# Patient Record
Sex: Female | Born: 2000 | Race: Black or African American | Hispanic: No | Marital: Single | Smoking: Never smoker
Health system: Southern US, Community
[De-identification: ages and names within clinical notes are randomized; demographics above are authoritative.]

## PROBLEM LIST (undated history)

## (undated) ENCOUNTER — Inpatient Hospital Stay (HOSPITAL_COMMUNITY): Payer: Self-pay

---

## 2018-07-13 ENCOUNTER — Inpatient Hospital Stay (HOSPITAL_COMMUNITY): Payer: BLUE CROSS/BLUE SHIELD

## 2018-07-13 ENCOUNTER — Encounter (HOSPITAL_COMMUNITY): Payer: Self-pay | Admitting: *Deleted

## 2018-07-13 ENCOUNTER — Inpatient Hospital Stay (HOSPITAL_COMMUNITY)
Admission: AD | Admit: 2018-07-13 | Discharge: 2018-07-13 | Disposition: A | Discharge status: Home / Self Care | Payer: BLUE CROSS/BLUE SHIELD | Source: Ambulatory Visit | Attending: Family Medicine | Admitting: Family Medicine

## 2018-07-13 DIAGNOSIS — O26891 Other specified pregnancy related conditions, first trimester: Secondary | ICD-10-CM | POA: Diagnosis not present

## 2018-07-13 DIAGNOSIS — O418X10 Other specified disorders of amniotic fluid and membranes, first trimester, not applicable or unspecified: Secondary | ICD-10-CM

## 2018-07-13 DIAGNOSIS — Z3A08 8 weeks gestation of pregnancy: Secondary | ICD-10-CM | POA: Insufficient documentation

## 2018-07-13 DIAGNOSIS — O208 Other hemorrhage in early pregnancy: Secondary | ICD-10-CM | POA: Insufficient documentation

## 2018-07-13 DIAGNOSIS — R109 Unspecified abdominal pain: Secondary | ICD-10-CM | POA: Insufficient documentation

## 2018-07-13 DIAGNOSIS — O468X1 Other antepartum hemorrhage, first trimester: Secondary | ICD-10-CM

## 2018-07-13 DIAGNOSIS — O3680X0 Pregnancy with inconclusive fetal viability, not applicable or unspecified: Secondary | ICD-10-CM

## 2018-07-13 DIAGNOSIS — Z3401 Encounter for supervision of normal first pregnancy, first trimester: Secondary | ICD-10-CM

## 2018-07-13 LAB — URINALYSIS, ROUTINE W REFLEX MICROSCOPIC
Bilirubin Urine: NEGATIVE
Glucose, UA: NEGATIVE mg/dL
Hgb urine dipstick: NEGATIVE
Ketones, ur: NEGATIVE mg/dL
Leukocytes, UA: NEGATIVE
NITRITE: NEGATIVE
Protein, ur: NEGATIVE mg/dL
SPECIFIC GRAVITY, URINE: 1.011 (ref 1.005–1.030)
pH: 7 (ref 5.0–8.0)

## 2018-07-13 LAB — CBC
HEMATOCRIT: 33.4 % — AB (ref 36.0–46.0)
Hemoglobin: 11.5 g/dL — ABNORMAL LOW (ref 12.0–15.0)
MCH: 28.8 pg (ref 26.0–34.0)
MCHC: 34.4 g/dL (ref 30.0–36.0)
MCV: 83.5 fL (ref 78.0–100.0)
Platelets: 180 10*3/uL (ref 150–400)
RBC: 4 MIL/uL (ref 3.87–5.11)
RDW: 12.6 % (ref 11.5–15.5)
WBC: 4.4 10*3/uL (ref 4.0–10.5)

## 2018-07-13 LAB — TYPE AND SCREEN
ABO/RH(D): O POS
Antibody Screen: NEGATIVE

## 2018-07-13 LAB — HCG, QUANTITATIVE, PREGNANCY: HCG, BETA CHAIN, QUANT, S: 175800 m[IU]/mL — AB (ref <5)

## 2018-07-13 LAB — WET PREP, GENITAL
Clue Cells Wet Prep HPF POC: NONE SEEN
Sperm: NONE SEEN
TRICH WET PREP: NONE SEEN
WBC, Wet Prep HPF POC: NONE SEEN
Yeast Wet Prep HPF POC: NONE SEEN

## 2018-07-13 LAB — POCT PREGNANCY, URINE: Preg Test, Ur: POSITIVE — AB

## 2018-07-13 LAB — ABO/RH: ABO/RH(D): O POS

## 2018-07-13 MED ORDER — VITAFOL-OB+DHA 65-1 & 250 MG PO MISC: 1.0000 | Freq: Every day | ORAL | 3 refills | Status: AC

## 2018-07-13 NOTE — MAU Provider Note (Addendum)
History     CSN: [id number]  Arrival date and time: 07/13/18 1340   First Provider Initiated Contact with Patient 07/13/18 1423      Chief Complaint  Patient presents with  . Abdominal Pain  . Vaginal Bleeding  . Possible Pregnancy   HPI  Katrina Black is a 17 y.o. G1P0 at [redacted]w[redacted]d by LMP who presents to MAU with chief complaint of abdominal cramping and vaginal spotting, onset two days ago. Denies abnormal vaginal discharge, fever, falls, or recent illness.  Has not initiated prenatal care, no previous imaging this pregnancy.  Abdominal Cramping  This is a new problem, onset two days ago. Pain is bilateral low abdomen radiating to low back, 1-2/10, denies aggravating or alleviating factor. Patient also endorses intermittent abdominal cramping that is suprapubic and occurs with voiding  Vaginal spotting This is a new problem, onset two days ago in conjunction with abdominal cramping. Patient states she saturated two pantiliners over the course of a 24 hour period. Most recent sexual intercourse 2-3 days ago, denies pain during sex.   OB History    Gravida  1   Para      Term      Preterm      AB      Living        SAB      TAB      Ectopic      Multiple      Live Births              Social History   Tobacco Use  . Smoking status: Never Smoker  . Smokeless tobacco: Never Used  Substance Use Topics  . Alcohol use: Not Currently    Comment: occasional  . Drug use: Not on file    Allergies: No Known Allergies  No medications prior to admission.    Review of Systems  Constitutional: Negative for fatigue and fever.  Gastrointestinal: Positive for abdominal pain. Negative for nausea and vomiting.  Genitourinary: Positive for dysuria and vaginal bleeding. Negative for difficulty urinating, dyspareunia and pelvic pain.  Musculoskeletal: Positive for back pain.  Neurological: Negative for headaches.  All other systems reviewed and are negative.  Physical  Exam   Blood pressure 115/62, pulse 66, temperature 98.4 F (36.9 C), temperature source Oral, resp. rate 17, height 5' 7.5" (1.715 m), weight 62.6 kg, last menstrual period 05/18/2018, SpO2 100 %.  Physical Exam  Nursing note and vitals reviewed. Constitutional: She is oriented to person, place, and time. She appears well-developed and well-nourished.  Cardiovascular: Normal rate and intact distal pulses.  Respiratory: Effort normal and breath sounds normal.  GI: Soft. Bowel sounds are normal. She exhibits no distension. There is no tenderness. There is no rebound, no guarding and no CVA tenderness.  Genitourinary: Vagina normal and uterus normal.  Genitourinary Comments: No bleeding or abnormal discharge visualized on SSE   Neurological: She is alert and oriented to person, place, and time.  Skin: Skin is warm and dry.  Psychiatric: She has a normal mood and affect. Her behavior is normal. Judgment and thought content normal.    MAU Course  Procedures  MDM   Patient Vitals for the past 24 hrs:  BP Temp Temp src Pulse Resp SpO2 Height Weight  07/13/18 1358 115/62 98.4 F (36.9 C) Oral 66 17 100 % 5' 7.5" (1.715 m) 62.6 kg    Results for orders placed or performed during the hospital encounter of 07/13/18 (from the past 24  hour(s))  Urinalysis, Routine w reflex microscopic     Status: None   Collection Time: 07/13/18  2:14 PM  Result Value Ref Range   Color, Urine YELLOW YELLOW   APPearance CLEAR CLEAR   Specific Gravity, Urine 1.011 1.005 - 1.030   pH 7.0 5.0 - 8.0   Glucose, UA NEGATIVE NEGATIVE mg/dL   Hgb urine dipstick NEGATIVE NEGATIVE   Bilirubin Urine NEGATIVE NEGATIVE   Ketones, ur NEGATIVE NEGATIVE mg/dL   Protein, ur NEGATIVE NEGATIVE mg/dL   Nitrite NEGATIVE NEGATIVE   Leukocytes, UA NEGATIVE NEGATIVE  Pregnancy, urine POC     Status: Abnormal   Collection Time: 07/13/18  2:17 PM  Result Value Ref Range   Preg Test, Ur POSITIVE (A) NEGATIVE  CBC      Status: Abnormal   Collection Time: 07/13/18  2:27 PM  Result Value Ref Range   WBC 4.4 4.0 - 10.5 K/uL   RBC 4.00 3.87 - 5.11 MIL/uL   Hemoglobin 11.5 (L) 12.0 - 15.0 g/dL   HCT 01.0 (L) 27.2 - 53.6 %   MCV 83.5 78.0 - 100.0 fL   MCH 28.8 26.0 - 34.0 pg   MCHC 34.4 30.0 - 36.0 g/dL   RDW 64.4 03.4 - 74.2 %   Platelets 180 150 - 400 K/uL  Type and screen [facility]     Status: None (Preliminary result)   Collection Time: 07/13/18  2:27 PM  Result Value Ref Range   ABO/RH(D) O POS    Antibody Screen PENDING    Sample Expiration      07/16/2018 Performed at [facility], [address] [place], [place] [address]   hCG, quantitative, pregnancy     Status: Abnormal   Collection Time: 07/13/18  2:27 PM  Result Value Ref Range   hCG, Beta Chain, Quant, S 175,800 (H) <5 mIU/mL  Wet prep, genital     Status: None   Collection Time: 07/13/18  2:36 PM  Result Value Ref Range   Yeast Wet Prep HPF POC NONE SEEN NONE SEEN   Trich, Wet Prep NONE SEEN NONE SEEN   Clue Cells Wet Prep HPF POC NONE SEEN NONE SEEN   WBC, Wet Prep HPF POC NONE SEEN NONE SEEN   Sperm NONE SEEN    US Ob Comp Less 14 Wks  Result Date: 07/13/2018 CLINICAL DATA:  Abdominal pain in first trimester of pregnancy, pregnancy of unknown location; LMP 05/18/2018 EXAM: OBSTETRIC <14 WK [place] AND TRANSVAGINAL OB US TECHNIQUE: Both transabdominal and transvaginal ultrasound examinations were performed for complete evaluation of the gestation as well as the maternal uterus, adnexal regions, and pelvic cul-de-sac. Transvaginal technique was performed to assess early pregnancy. COMPARISON:  None FINDINGS: Intrauterine gestational sac: Present, single Yolk sac:  Present Embryo:  Present Cardiac Activity: Present Heart Rate: 157 bpm CRL:  11.7 mm   7 w   2 d                  [place] EDC: 02/27/2019 Subchorionic hemorrhage:  Small subchronic hemorrhage visualized. Maternal uterus/adnexae: LEFT ovary normal size and  morphology, 2.2 x 2.6 x 2.0 cm. RIGHT ovary normal size and morphology, 3.4 x 3.3 x 2.2 cm. No adnexal masses or free pelvic fluid. IMPRESSION: Single live intrauterine gestation at 7 weeks 2 days EGA by crown-rump length. Small subchronic hemorrhage. Electronically Signed   By: Ulyses Southward M.D.   On: 07/13/2018 15:28    Assessment and Plan  --17  y.o. G1P0 with IUP at 8w 0d by LMP, 7w 2d by [place] today --Subchorionic hemorrhage, discussed pelvic rest, no activities which might precipitate fall --Blood type O POS, Rhogam not indicated --Paper rx for prenatal vitamins --Discharge home in stable condition  F/U: Patient to initiate prenatal care, plans to establish with either [facility] or [facility]. Contact info given  Calvert Cantor, CNM 07/13/2018, 3:58 PM

## 2018-07-13 NOTE — MAU Note (Signed)
The past 2 days has been bleeding.  Having slight cramps.  Has not been seen anywhere yet, other then her school confirmed preg.

## 2018-07-13 NOTE — Discharge Instructions (Signed)
Safe Medications in Pregnancy  ° °Acne: °Benzoyl Peroxide °Salicylic Acid ° °Backache/Headache: °Tylenol: 2 regular strength every 4 hours OR °             2 Extra strength every 6 hours ° °Colds/Coughs/Allergies: °Benadryl (alcohol free) 25 mg every 6 hours as needed °Breath right strips °Claritin °Cepacol throat lozenges °Chloraseptic throat spray °Cold-Eeze- up to three times per day °Cough drops, alcohol free °Flonase (by prescription only) °Guaifenesin °Mucinex °Robitussin DM (plain only, alcohol free) °Saline nasal spray/drops °Sudafed (pseudoephedrine) & Actifed ** use only after [redacted] weeks gestation and if you do not have high blood pressure °Tylenol °Vicks Vaporub °Zinc lozenges °Zyrtec  ° °Constipation: °Colace °Ducolax suppositories °Fleet enema °Glycerin suppositories °Metamucil °Milk of magnesia °Miralax °Senokot °Smooth move tea ° °Diarrhea: °Kaopectate °Imodium A-D ° °*NO pepto Bismol ° °Hemorrhoids: °Anusol °Anusol HC °Preparation H °Tucks ° °Indigestion: °Tums °Maalox °Mylanta °Zantac  °Pepcid ° °Insomnia: °Benadryl (alcohol free) 25mg every 6 hours as needed °Tylenol PM °Unisom, no Gelcaps ° °Leg Cramps: °Tums °MagGel ° °Nausea/Vomiting:  °Bonine °Dramamine °Emetrol °Ginger extract °Sea bands °Meclizine  °Nausea medication to take during pregnancy:  °Unisom (doxylamine succinate 25 mg tablets) Take one tablet daily at bedtime. If symptoms are not adequately controlled, the dose can be increased to a maximum recommended dose of two tablets daily (1/2 tablet in the morning, 1/2 tablet mid-afternoon and one at bedtime). °Vitamin B6 100mg tablets. Take one tablet twice a day (up to 200 mg per day). ° °Skin Rashes: °Aveeno products °Benadryl cream or 25mg every 6 hours as needed °Calamine Lotion °1% cortisone cream ° °Yeast infection: °Gyne-lotrimin 7 °Monistat 7 ° ° °**If taking multiple medications, please check labels to avoid duplicating the same active ingredients °**take medication as directed on  the label °** Do not exceed 4000 mg of tylenol in 24 hours °**Do not take medications that contain aspirin or ibuprofen ° ° ° °[facility] °         °[facility]  ° Phone: [phone] ° °[facility]  ° Phone: [phone] ° °[facility]      ° Phone: [phone]   °         °[facility] @ [facility]    ° Phone: [phone] °         °[facility] [place]  ° Phone: [phone] ° °[facility] ° Phone: [phone] °    °[facility]) ° Phone: [phone] ° °

## 2018-07-14 LAB — GC/CHLAMYDIA PROBE AMP (~~LOC~~) NOT AT ARMC
Chlamydia: NEGATIVE
NEISSERIA GONORRHEA: NEGATIVE

## 2019-05-17 ENCOUNTER — Encounter (HOSPITAL_COMMUNITY): Payer: Self-pay

## 2019-12-18 ENCOUNTER — Other Ambulatory Visit: Payer: Self-pay

## 2019-12-18 ENCOUNTER — Encounter (HOSPITAL_COMMUNITY): Payer: Self-pay | Admitting: Emergency Medicine

## 2019-12-18 ENCOUNTER — Ambulatory Visit (HOSPITAL_COMMUNITY)
Admission: EM | Admit: 2019-12-18 | Discharge: 2019-12-18 | Disposition: A | Discharge status: Home / Self Care | Payer: BLUE CROSS/BLUE SHIELD | Attending: Emergency Medicine | Admitting: Emergency Medicine

## 2019-12-18 DIAGNOSIS — S0501XA Injury of conjunctiva and corneal abrasion without foreign body, right eye, initial encounter: Secondary | ICD-10-CM

## 2019-12-18 MED ORDER — ERYTHROMYCIN 5 MG/GM OP OINT: TOPICAL_OINTMENT | OPHTHALMIC | 0 refills | Status: AC

## 2019-12-18 MED ORDER — TETRACAINE HCL 0.5 % OP SOLN
OPHTHALMIC | Status: AC
  Filled 2019-12-18: qty 4

## 2019-12-18 NOTE — ED Provider Notes (Signed)
[facility]    CSN: [id number] Arrival date & time: 12/18/19  1044      History   Chief Complaint Chief Complaint  Patient presents with  . Eye Pain    HPI Katrina Black is a 19 y.o. female no significant past medical history presenting today for evaluation of bilateral eye pain. Patient states that she had fake eyelashes placed on her lids on Friday, that evening she began to develop discomfort to her eyes, most prominently in the right eye. Worsened into Saturday. She removed the lashes, but continues to have discomfort. She denies any changes in vision. Describes pain as a soreness and feels as if her eyes bruised. She has noticed redness to the inferior portion of her right eye. Mild photophobia. Denies contact use. Denies associated URI symptoms.  HPI  History reviewed. No pertinent past medical history.  There are no problems to display for this patient.   History reviewed. No pertinent surgical history.  OB History    Gravida  1   Para      Term      Preterm      AB      Living        SAB      TAB      Ectopic      Multiple      Live Births               Home Medications    Prior to Admission medications   Medication Sig Start Date End Date Taking? Authorizing Provider  erythromycin ophthalmic ointment Place a 1/2 inch ribbon of ointment into the lower eyelid 4-6 times daily for 7-10 days 12/18/19   Americo Vallery C, PA-C  Prenatal MV-Min-Fe Fum-FA-DHA (VITAFOL-OB+DHA) 65-1 & 250 MG MISC Take 1 tablet by mouth at bedtime. 07/13/18   Calvert Cantor, CNM    Family History Family History  Problem Relation Age of Onset  . Healthy Mother   . Healthy Father     Social History Social History   Tobacco Use  . Smoking status: Never Smoker  . Smokeless tobacco: Never Used  Substance Use Topics  . Alcohol use: Yes    Comment: occasional  . Drug use: Never     Allergies   Patient has no known allergies.   Review of  Systems Review of Systems  Constitutional: Negative for activity change, appetite change, chills, fatigue and fever.  HENT: Negative for congestion, ear pain, rhinorrhea, sinus pressure, sore throat and trouble swallowing.   Eyes: Positive for photophobia, pain and redness. Negative for discharge, itching and visual disturbance.  Respiratory: Negative for cough, chest tightness and shortness of breath.   Cardiovascular: Negative for chest pain.  Gastrointestinal: Negative for abdominal pain, diarrhea, nausea and vomiting.  Musculoskeletal: Negative for myalgias.  Skin: Negative for rash.  Neurological: Negative for dizziness, light-headedness and headaches.     Physical Exam Triage Vital Signs ED Triage Vitals  Enc Vitals Group     BP 12/18/19 1102 104/68     Pulse Rate 12/18/19 1102 99     Resp 12/18/19 1102 18     Temp 12/18/19 1102 98.3 F (36.8 C)     Temp Source 12/18/19 1102 Oral     SpO2 12/18/19 1102 99 %     Weight --      Height --      Head Circumference --      Peak Flow --  Pain Score 12/18/19 1103 6     Pain Loc --      Pain Edu? --      Excl. in [place]? --    No data found.  Updated Vital Signs BP 104/68 (BP Location: Right Arm)   Pulse 99   Temp 98.3 F (36.8 C) (Oral)   Resp 18   SpO2 99%   Visual Acuity Right Eye Distance:  20/25 Left Eye Distance:  20/20 Bilateral Distance:  20/15  Right Eye Near:   Left Eye Near:    Bilateral Near:     Physical Exam Vitals and nursing note reviewed.  Constitutional:      Appearance: She is well-developed.     Comments: No acute distress  HENT:     Head: Normocephalic and atraumatic.     Nose: Nose normal.  Eyes:     Extraocular Movements: Extraocular movements intact.     Pupils: Pupils are equal, round, and reactive to light.     Comments: Bilateral eyes with more prominent/erythematous vasculature noted to inferior aspects of eyes, right eye with area of conjunctival erythema noted within right  lower quadrant and mid eye, anterior chamber clear, linear corneal abrasion noted with fluorescein staining-see below  Cardiovascular:     Rate and Rhythm: Normal rate.  Pulmonary:     Effort: Pulmonary effort is normal. No respiratory distress.  Abdominal:     General: There is no distension.  Musculoskeletal:        General: Normal range of motion.     Cervical back: Neck supple.  Skin:    General: Skin is warm and dry.  Neurological:     Mental Status: She is alert and oriented to person, place, and time.        UC Treatments / Results  Labs (all labs ordered are listed, but only abnormal results are displayed) Labs Reviewed - No data to display  EKG   Radiology No results found.  Procedures Procedures (including critical care time)  Medications Ordered in UC Medications - No data to display  Initial Impression / Assessment and Plan / UC Course  I have reviewed the triage vital signs and the nursing notes.  Pertinent labs & imaging results that were available during my care of the patient were reviewed by me and considered in my medical decision making (see chart for details).     Visual acuity intact, fluorescein stain suggestive of corneal abrasion with surrounding erythema. Placing on an erythromycin ointment to apply topically 4-6 times daily, follow-up with ophthalmology.  Discussed strict return precautions. Patient verbalized understanding and is agreeable with plan.  Final Clinical Impressions(s) / UC Diagnoses   Final diagnoses:  Abrasion of right cornea, initial encounter     Discharge Instructions     Please use erythromycin ointment 4-6 times daily over the next 7-10 days Follow up with ophthalmology if persisting  Return if developing increased pain, changes in vision, eye swelling   ED Prescriptions    Medication Sig Dispense Auth. Provider   erythromycin ophthalmic ointment Place a 1/2 inch ribbon of ointment into the lower eyelid 4-6  times daily for 7-10 days 3.5 g Valeta Paz, [place] C, PA-C     PDMP not reviewed this encounter.   Janith Lima, PA-C 12/18/19 1143

## 2019-12-18 NOTE — ED Triage Notes (Signed)
Pt sts bilateral eye pain and irritation after having fake lashes placed on Friday; pt sts right worse than left

## 2019-12-18 NOTE — Discharge Instructions (Signed)
Please use erythromycin ointment 4-6 times daily over the next 7-10 days Follow up with ophthalmology if persisting  Return if developing increased pain, changes in vision, eye swelling

## 2020-04-15 IMAGING — US US OB COMP LESS 14 WK
1 series · 15 of 28 positions shown · non-contrast
Comparison: None

ADDENDUM:
Incorrect header was utilized for the initial report.

Corrected exam performed was:
ULTRASOUND OBSTETRICAL COMPLETE < 14 WEEKS
Transvaginal imaging was not performed.
CLINICAL DATA: Abdominal pain in first trimester of pregnancy,
pregnancy of unknown location; LMP 05/18/2018
EXAM:
OBSTETRIC <14 WK US AND TRANSVAGINAL OB US
TECHNIQUE: Both transabdominal and transvaginal ultrasound examinations were
performed for complete evaluation of the gestation as well as the
maternal uterus, adnexal regions, and pelvic cul-de-sac.
Transvaginal technique was performed to assess early pregnancy.

[Series 1: us ob comp less 14 wk · 51 acquisitions, 15 frames shown]
[im 1/51]
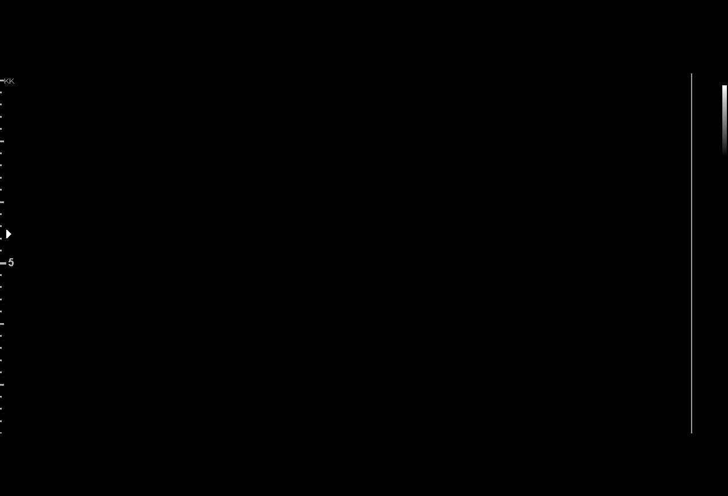
[im 4/51]
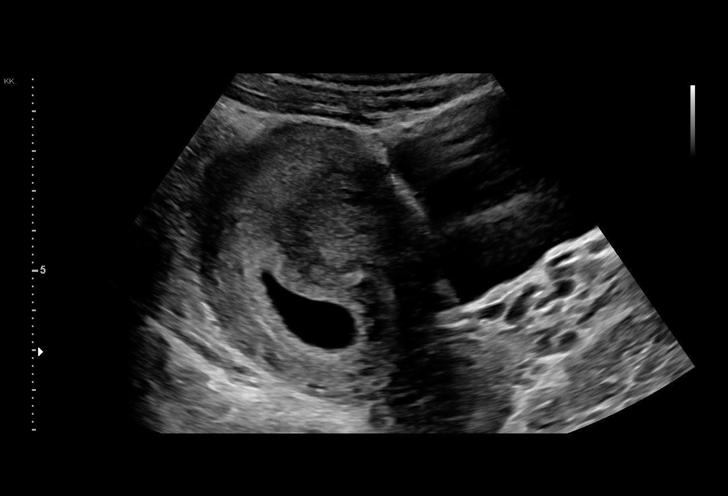
[im 8/51]
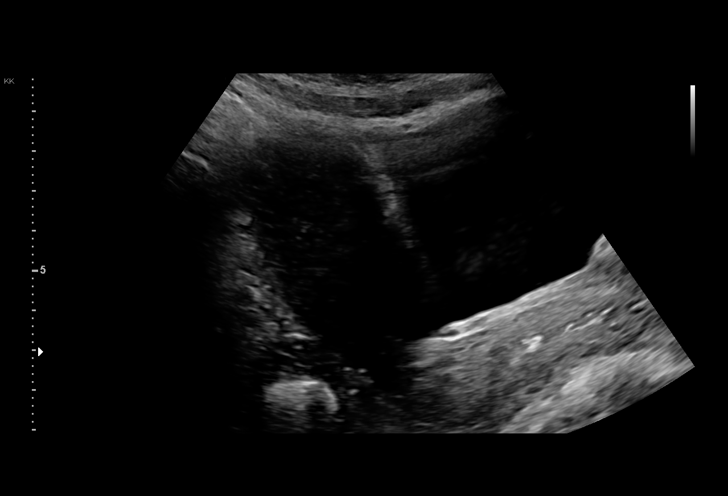
[im 12/51]
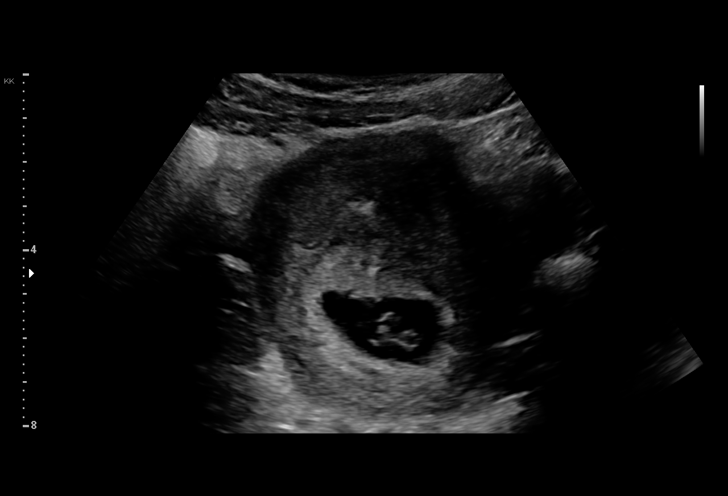
[im 15/51]
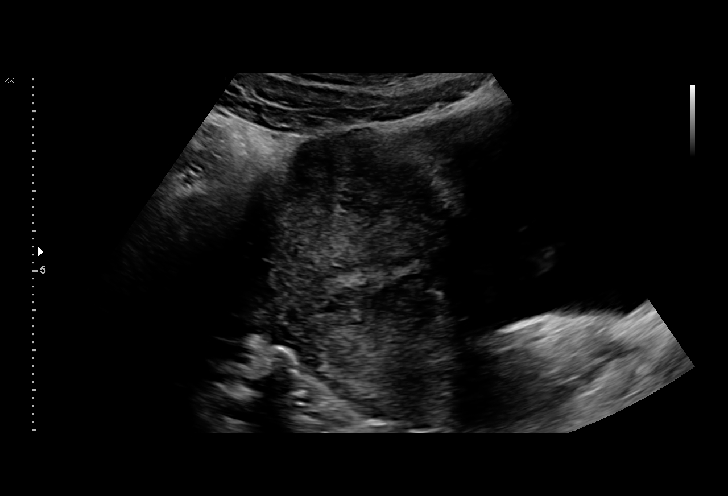
[im 19/51]
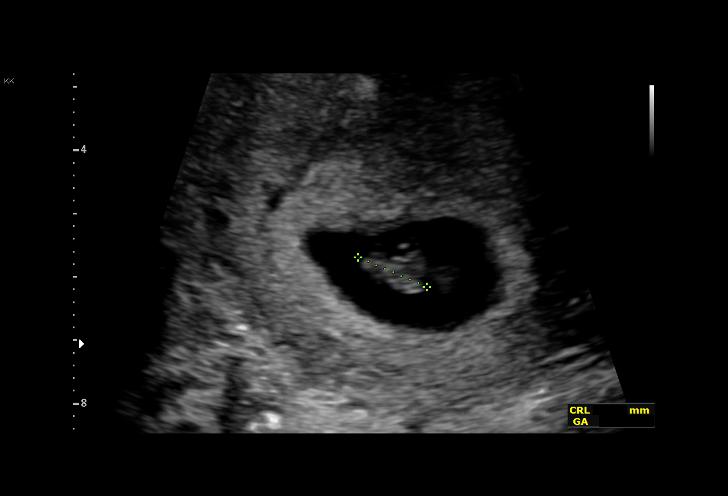
[im 23/51]
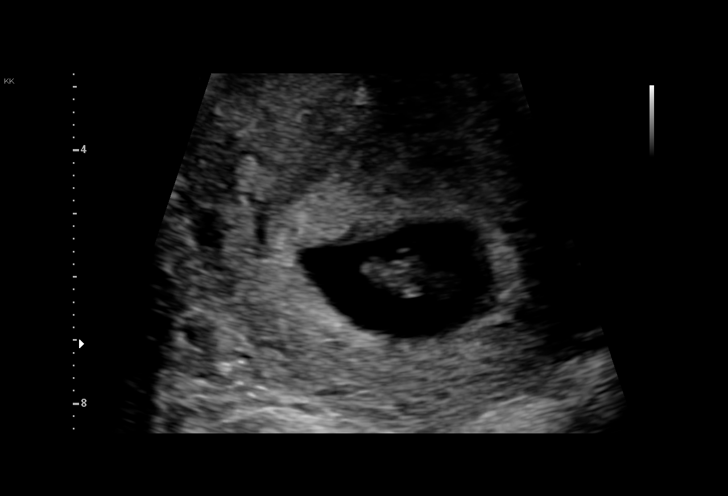
[im 26/51]
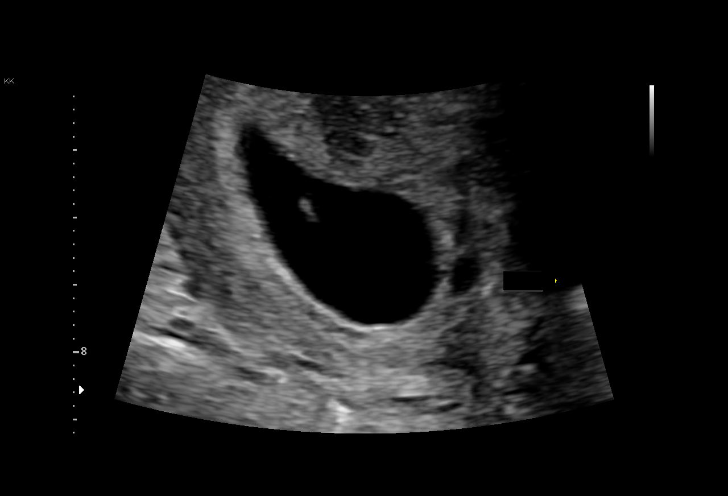
[im 28/51]
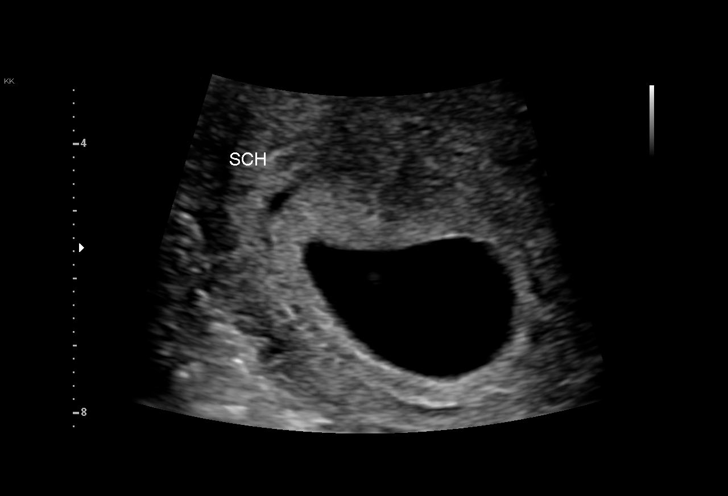
[im 32/51]
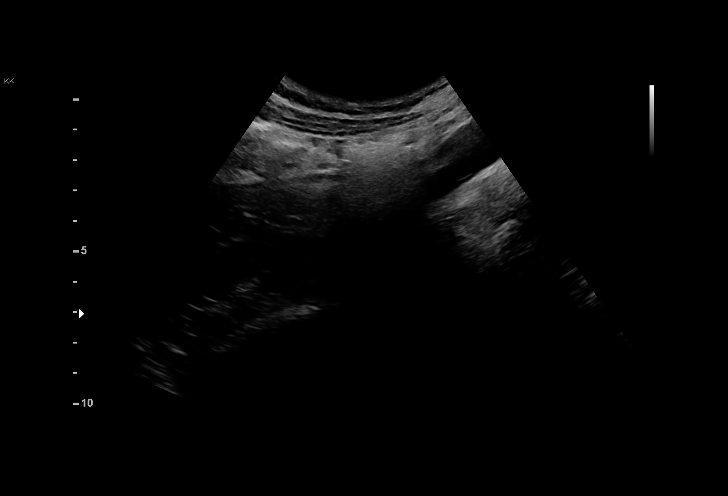
[im 36/51]
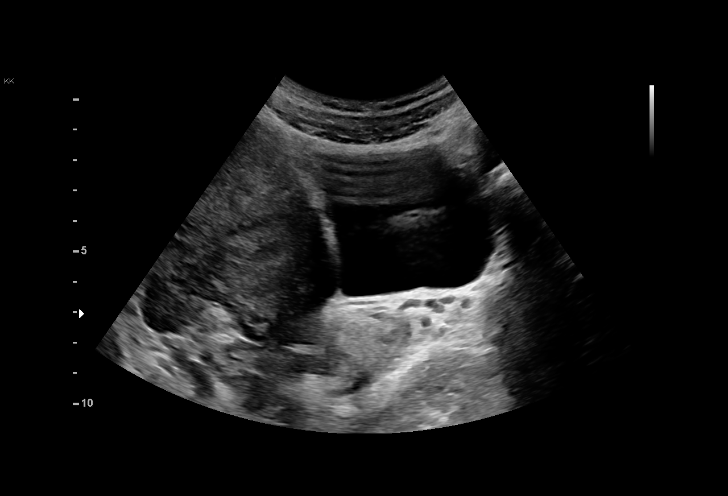
[im 39/51]
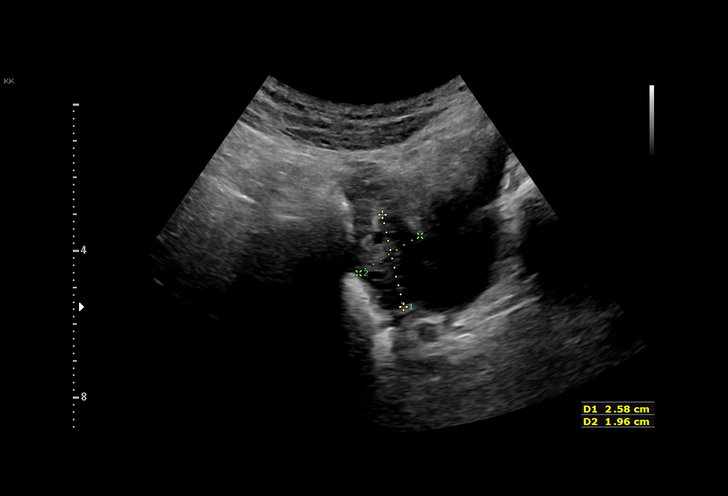
[im 43/51]
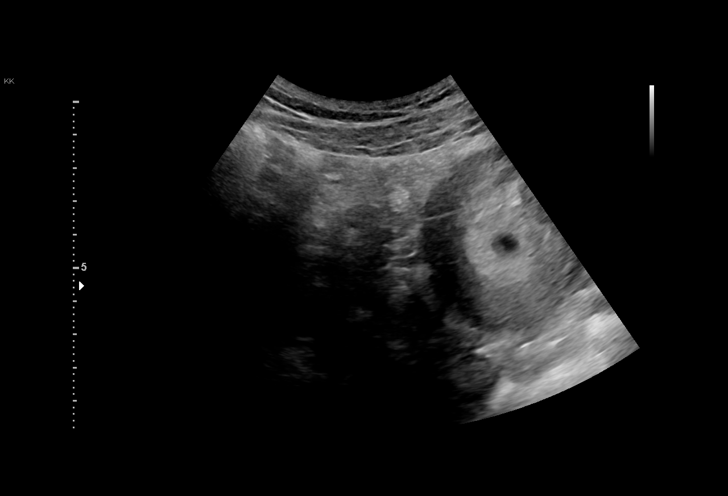
[im 47/51]
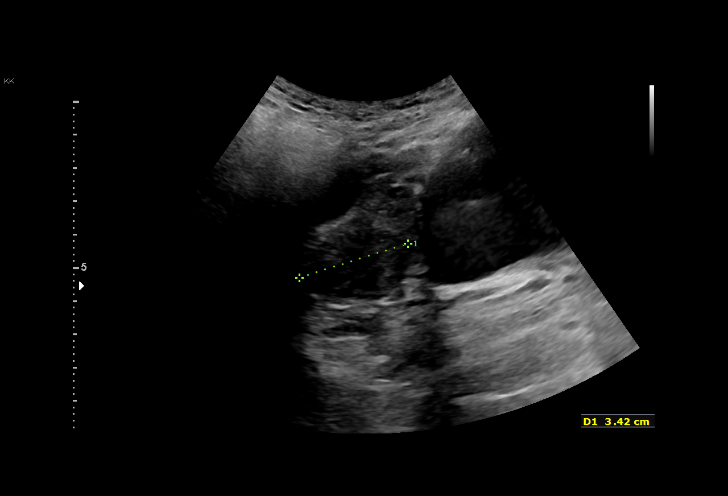
[im 51/51]
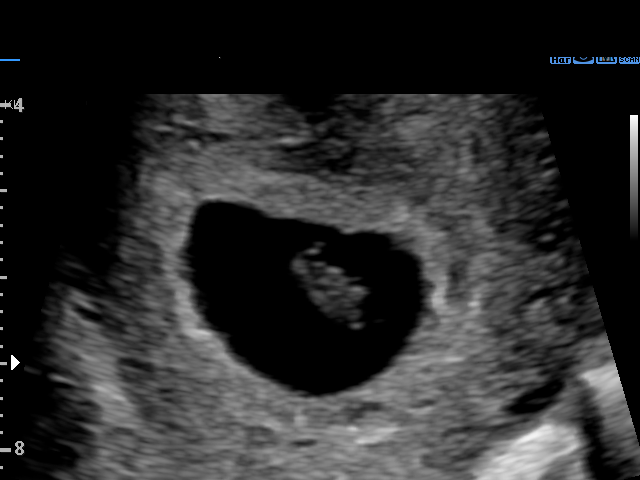

[15 of 28 positions shown; findings below may reference images not displayed]

FINDINGS: Intrauterine gestational sac: Present, single

Yolk sac:  Present

Embryo:  Present

Cardiac Activity: Present

Heart Rate: 157 bpm

CRL:  11.7 mm   7 w   2 d                  US EDC: 02/27/2019

Subchorionic hemorrhage:  Small subchronic hemorrhage visualized.

Maternal uterus/adnexae:

LEFT ovary normal size and morphology, 2.2 x 2.6 x 2.0 cm.

RIGHT ovary normal size and morphology, 3.4 x 3.3 x 2.2 cm.

No adnexal masses or free pelvic fluid.
IMPRESSION: Single live intrauterine gestation at 7 weeks 2 days EGA by
crown-rump length.

Small subchronic hemorrhage.
